# Patient Record
Sex: Male | Born: 1995 | Race: Black or African American | Hispanic: No | Marital: Single | Smoking: Never smoker
Health system: Southern US, Community
[De-identification: ages and names within clinical notes are randomized; demographics above are authoritative.]

---
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
## 2015-10-24 ENCOUNTER — Emergency Department (HOSPITAL_COMMUNITY)
Admission: EM | Admit: 2015-10-24 | Discharge: 2015-10-24 | Disposition: A | Discharge status: Home / Self Care | Payer: Self-pay | Attending: Emergency Medicine | Admitting: Emergency Medicine

## 2015-10-24 ENCOUNTER — Encounter (HOSPITAL_COMMUNITY): Payer: Self-pay | Admitting: Vascular Surgery

## 2015-10-24 DIAGNOSIS — Z202 Contact with and (suspected) exposure to infections with a predominantly sexual mode of transmission: Secondary | ICD-10-CM | POA: Insufficient documentation

## 2015-10-24 LAB — URINALYSIS, ROUTINE W REFLEX MICROSCOPIC
BILIRUBIN URINE: NEGATIVE
GLUCOSE, UA: NEGATIVE mg/dL
HGB URINE DIPSTICK: NEGATIVE
KETONES UR: NEGATIVE mg/dL
Leukocytes, UA: NEGATIVE
Nitrite: NEGATIVE
PH: 6.5 (ref 5.0–8.0)
Protein, ur: NEGATIVE mg/dL
Specific Gravity, Urine: 1.023 (ref 1.005–1.030)

## 2015-10-24 NOTE — ED Notes (Signed)
Declined W/C at D/C and was escorted to lobby by RN. 

## 2015-10-24 NOTE — ED Triage Notes (Signed)
PT up to BR to collect urine sample.
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
## 2015-10-25 LAB — GC/CHLAMYDIA PROBE AMP (~~LOC~~) NOT AT ARMC
Chlamydia: NEGATIVE
NEISSERIA GONORRHEA: NEGATIVE

## 2015-10-25 LAB — HIV ANTIBODY (ROUTINE TESTING W REFLEX): HIV SCREEN 4TH GENERATION: NONREACTIVE

## 2015-10-25 LAB — RPR: RPR Ser Ql: NONREACTIVE

## 2016-03-11 ENCOUNTER — Emergency Department (HOSPITAL_COMMUNITY): Payer: No Typology Code available for payment source

## 2016-03-11 ENCOUNTER — Encounter (HOSPITAL_COMMUNITY): Payer: Self-pay

## 2016-03-11 ENCOUNTER — Emergency Department (HOSPITAL_COMMUNITY)
Admission: EM | Admit: 2016-03-11 | Discharge: 2016-03-11 | Disposition: A | Discharge status: Home / Self Care | Payer: No Typology Code available for payment source | Attending: Emergency Medicine | Admitting: Emergency Medicine

## 2016-03-11 DIAGNOSIS — Y92410 Unspecified street and highway as the place of occurrence of the external cause: Secondary | ICD-10-CM | POA: Insufficient documentation

## 2016-03-11 DIAGNOSIS — Y999 Unspecified external cause status: Secondary | ICD-10-CM | POA: Diagnosis not present

## 2016-03-11 DIAGNOSIS — M545 Low back pain, unspecified: Secondary | ICD-10-CM

## 2016-03-11 DIAGNOSIS — Y939 Activity, unspecified: Secondary | ICD-10-CM | POA: Insufficient documentation

## 2016-03-11 MED ORDER — IBUPROFEN 800 MG PO TABS: 800.0000 mg | ORAL_TABLET | Freq: Three times a day (TID) | ORAL | 0 refills | Status: AC

## 2016-03-11 MED ORDER — CYCLOBENZAPRINE HCL 10 MG PO TABS: 10.0000 mg | ORAL_TABLET | Freq: Two times a day (BID) | ORAL | 0 refills | Status: AC | PRN
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
## 2016-05-12 ENCOUNTER — Emergency Department (HOSPITAL_BASED_OUTPATIENT_CLINIC_OR_DEPARTMENT_OTHER)
Admission: EM | Admit: 2016-05-12 | Discharge: 2016-05-12 | Disposition: A | Discharge status: Home / Self Care | Payer: BLUE CROSS/BLUE SHIELD | Attending: Emergency Medicine | Admitting: Emergency Medicine

## 2016-05-12 ENCOUNTER — Encounter (HOSPITAL_BASED_OUTPATIENT_CLINIC_OR_DEPARTMENT_OTHER): Payer: Self-pay | Admitting: Adult Health

## 2016-05-12 DIAGNOSIS — R300 Dysuria: Secondary | ICD-10-CM | POA: Diagnosis present

## 2016-05-12 DIAGNOSIS — Z79899 Other long term (current) drug therapy: Secondary | ICD-10-CM | POA: Diagnosis not present

## 2016-05-12 DIAGNOSIS — Z791 Long term (current) use of non-steroidal anti-inflammatories (NSAID): Secondary | ICD-10-CM | POA: Insufficient documentation

## 2016-05-12 DIAGNOSIS — N342 Other urethritis: Secondary | ICD-10-CM

## 2016-05-12 LAB — URINALYSIS, ROUTINE W REFLEX MICROSCOPIC
BILIRUBIN URINE: NEGATIVE
GLUCOSE, UA: NEGATIVE mg/dL
HGB URINE DIPSTICK: NEGATIVE
Ketones, ur: NEGATIVE mg/dL
Leukocytes, UA: NEGATIVE
Nitrite: NEGATIVE
PROTEIN: NEGATIVE mg/dL
SPECIFIC GRAVITY, URINE: 1.005 (ref 1.005–1.030)
pH: 6.5 (ref 5.0–8.0)

## 2016-05-12 MED ORDER — CEFTRIAXONE SODIUM 250 MG IJ SOLR
250.0000 mg | INTRAMUSCULAR | Status: DC
  Administered 2016-05-12: 250 mg via INTRAMUSCULAR
  Filled 2016-05-12: qty 250

## 2016-05-12 MED ORDER — AZITHROMYCIN 250 MG PO TABS
1000.0000 mg | ORAL_TABLET | Freq: Once | ORAL | Status: AC
  Administered 2016-05-12: 1000 mg via ORAL
  Filled 2016-05-12: qty 4

## 2016-05-12 NOTE — ED Provider Notes (Addendum)
[facility]   CSN: [id number] Arrival date & time: 05/12/16  1335     History   Chief Complaint Chief Complaint  Patient presents with  . Dysuria    HPI Jerome Bishop is a 21 y.o. male. Chief complaint is burning with urination  HPI:  Patient presents with 2 days of burning with urination. No frank discharge. States that he had unprotected intercourse last week and then 5 days ago before symptoms started. No blood. No discharge spontaneously. No testicular pain or lesions. No additional concerns. History of STD in the past.  History reviewed. No pertinent past medical history.  There are no active problems to display for this patient.   History reviewed. No pertinent surgical history.     Home Medications    Prior to Admission medications   Medication Sig Start Date End Date Taking? Authorizing Provider  cyclobenzaprine (FLEXERIL) 10 MG tablet Take 1 tablet (10 mg total) by mouth 2 (two) times daily as needed for muscle spasms. 03/11/16   Emi HolesAlexandra M Law, PA-C  ibuprofen (ADVIL,MOTRIN) 800 MG tablet Take 1 tablet (800 mg total) by mouth 3 (three) times daily. 03/11/16   Emi HolesAlexandra M Law, PA-C    Family History History reviewed. No pertinent family history.  Social History Social History  Substance Use Topics  . Smoking status: Never Smoker  . Smokeless tobacco: Never Used  . Alcohol use No     Allergies   Penicillins   Review of Systems Review of Systems  Constitutional: Negative for appetite change, chills, diaphoresis, fatigue and fever.  HENT: Negative for mouth sores, sore throat and trouble swallowing.   Eyes: Negative for visual disturbance.  Respiratory: Negative for cough, chest tightness, shortness of breath and wheezing.   Cardiovascular: Negative for chest pain.  Gastrointestinal: Negative for abdominal distention, abdominal pain, diarrhea, nausea and vomiting.  Endocrine: Negative for polydipsia, polyphagia and  polyuria.  Genitourinary: Positive for dysuria and penile pain. Negative for frequency and hematuria.  Musculoskeletal: Negative for gait problem.  Skin: Negative for color change, pallor and rash.  Neurological: Negative for dizziness, syncope, light-headedness and headaches.  Hematological: Does not bruise/bleed easily.  Psychiatric/Behavioral: Negative for behavioral problems and confusion.     Physical Exam Updated Vital Signs BP 130/81 (BP Location: Right Arm)   Pulse 82   Temp 98.7 F (37.1 C) (Oral)   Resp 16   Ht 5\' 10"  (1.778 m)   Wt 154 lb 9 oz (70.1 kg)   SpO2 96%   BMI 22.18 kg/m   Physical Exam  Constitutional: He is oriented to person, place, and time. He appears well-developed and well-nourished. No distress.  HENT:  Head: Normocephalic.  Eyes: Conjunctivae are normal. Pupils are equal, round, and reactive to light. No scleral icterus.  Neck: Normal range of motion. Neck supple. No thyromegaly present.  Cardiovascular: Normal rate and regular rhythm.  Exam reveals no gallop and no friction rub.   No murmur heard. Pulmonary/Chest: Effort normal and breath sounds normal. No respiratory distress. He has no wheezes. He has no rales.  Abdominal: Soft. Bowel sounds are normal. He exhibits no distension. There is no tenderness. There is no rebound.  Genitourinary:  Genitourinary Comments: Normal GU exam. Patient had just urinated specimen was collected. No discharge noted. No lesions or sores. No pelvic lymphadenopathy.  Musculoskeletal: Normal range of motion.  Neurological: He is alert and oriented to person, place, and time.  Skin: Skin is warm and dry. No rash  noted.  Psychiatric: He has a normal mood and affect. His behavior is normal.     ED Treatments / Results  Labs (all labs ordered are listed, but only abnormal results are displayed) Labs Reviewed  URINALYSIS, ROUTINE W REFLEX MICROSCOPIC  GC/CHLAMYDIA PROBE AMP ([place]) NOT AT [facility]    EKG   EKG Interpretation None       Radiology No results found.  Procedures Procedures (including critical care time)  Medications Ordered in ED Medications  cefTRIAXone (ROCEPHIN) injection 250 mg (250 mg Intramuscular Given 05/12/16 1441)  azithromycin (ZITHROMAX) tablet 1,000 mg (1,000 mg Oral Given 05/12/16 1440)     Initial Impression / Assessment and Plan / ED Course  I have reviewed the triage vital signs and the nursing notes.  Pertinent labs & imaging results that were available during my care of the patient were reviewed by me and considered in my medical decision making (see chart for details).     Likely urethritis. Urine normal. GC chlamydia pending. I offered empiric treatment versus awaiting culture results and call back. He has a strong preference for empiric treatment. Given IM Rocephin 250 mg, and by mouth Zithromax 1 g. He is observed for 30 minutes after his Rocephin injection because of a reported history of penicillin allergy. He had no reaction. Discharged in stable condition. Use a condom.  Final Clinical Impressions(s) / ED Diagnoses   Final diagnoses:  Urethritis    New Prescriptions Discharge Medication List as of 05/12/2016  3:24 PM       Rolland Porter, MD 05/12/16 1551    Rolland Porter, MD 05/12/16 [phone]
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
## 2016-05-12 NOTE — ED Notes (Signed)
Patient denies pain and is resting comfortably.  

## 2016-12-21 ENCOUNTER — Emergency Department (HOSPITAL_BASED_OUTPATIENT_CLINIC_OR_DEPARTMENT_OTHER)
Admission: EM | Admit: 2016-12-21 | Discharge: 2016-12-21 | Disposition: A | Discharge status: Home / Self Care | Payer: BLUE CROSS/BLUE SHIELD | Attending: Emergency Medicine | Admitting: Emergency Medicine

## 2016-12-21 ENCOUNTER — Encounter (HOSPITAL_BASED_OUTPATIENT_CLINIC_OR_DEPARTMENT_OTHER): Payer: Self-pay | Admitting: *Deleted

## 2016-12-21 DIAGNOSIS — Z202 Contact with and (suspected) exposure to infections with a predominantly sexual mode of transmission: Secondary | ICD-10-CM | POA: Insufficient documentation

## 2016-12-21 DIAGNOSIS — Z711 Person with feared health complaint in whom no diagnosis is made: Secondary | ICD-10-CM

## 2016-12-21 LAB — URINALYSIS, ROUTINE W REFLEX MICROSCOPIC
Bilirubin Urine: NEGATIVE
Glucose, UA: NEGATIVE mg/dL
Hgb urine dipstick: NEGATIVE
Ketones, ur: NEGATIVE mg/dL
LEUKOCYTES UA: NEGATIVE
NITRITE: NEGATIVE
Protein, ur: NEGATIVE mg/dL
SPECIFIC GRAVITY, URINE: 1.015 (ref 1.005–1.030)
pH: 8.5 — ABNORMAL HIGH (ref 5.0–8.0)

## 2016-12-21 MED ORDER — AZITHROMYCIN 1 G PO PACK
1.0000 g | PACK | Freq: Once | ORAL | Status: AC
  Administered 2016-12-21: 1 g via ORAL
  Filled 2016-12-21: qty 1

## 2016-12-21 MED ORDER — CEFTRIAXONE SODIUM 250 MG IJ SOLR
250.0000 mg | Freq: Once | INTRAMUSCULAR | Status: AC
  Administered 2016-12-21: 250 mg via INTRAMUSCULAR
  Filled 2016-12-21 (×2): qty 250

## 2016-12-21 MED ORDER — METRONIDAZOLE 500 MG PO TABS
2000.0000 mg | ORAL_TABLET | Freq: Once | ORAL | Status: AC
  Administered 2016-12-21: 2000 mg via ORAL
  Filled 2016-12-21: qty 4

## 2016-12-21 MED ORDER — ONDANSETRON 4 MG PO TBDP
4.0000 mg | ORAL_TABLET | Freq: Once | ORAL | Status: AC
  Administered 2016-12-21: 4 mg via ORAL
  Filled 2016-12-21: qty 1

## 2016-12-21 NOTE — ED Triage Notes (Signed)
Exposure to an STD. No symptoms.

## 2016-12-21 NOTE — Discharge Instructions (Signed)
You were treated for gonorrhea, chlamydia, and trichomonas.   Use condoms , safe sex practices.   See your doctor  Return to ER if you have dysuria, penile discharge.

## 2016-12-21 NOTE — ED Provider Notes (Signed)
[facility]   CSN: [id number] Arrival date & time: 12/21/16  1641     History   Chief Complaint Chief Complaint  Patient presents with  . Exposure to STD    HPI Jerome Bishop is a 21 y.o. male here presenting with STD check. Patient states that he is sexually active with one male partner and she was just in the ER and was diagnosed with Trichomonas, bacterial vaginosis. He states that he wants to get checked and treated for STD. Denies any penile discharge or dysuria or rash.  The history is provided by the patient.    History reviewed. No pertinent past medical history.  There are no active problems to display for this patient.   History reviewed. No pertinent surgical history.     Home Medications    Prior to Admission medications   Medication Sig Start Date End Date Taking? Authorizing Provider  cyclobenzaprine (FLEXERIL) 10 MG tablet Take 1 tablet (10 mg total) by mouth 2 (two) times daily as needed for muscle spasms. 03/11/16   Law, Waylan Boga, PA-C  ibuprofen (ADVIL,MOTRIN) 800 MG tablet Take 1 tablet (800 mg total) by mouth 3 (three) times daily. 03/11/16   Emi Holes, PA-C    Family History No family history on file.  Social History Social History  Substance Use Topics  . Smoking status: Never Smoker  . Smokeless tobacco: Never Used  . Alcohol use No     Allergies   Penicillins   Review of Systems Review of Systems  Genitourinary: Negative for discharge, dysuria, genital sores and hematuria.  All other systems reviewed and are negative.    Physical Exam Updated Vital Signs BP (!) 151/105 (BP Location: Right Arm)   Pulse 63   Temp (!) 97.5 F (36.4 C) (Oral)   Resp 18   Ht  (1.803 m)   Wt 72.6 kg (160 lb)   SpO2 98%   BMI 22.32 kg/m   Physical Exam  Constitutional: He appears well-developed.  HENT:  Head: Normocephalic.  Eyes: Pupils are equal, round, and reactive to light.  Neck: Normal  range of motion.  Cardiovascular: Normal rate.   Pulmonary/Chest: Effort normal.  Abdominal: Soft.  Genitourinary:  Genitourinary Comments: No obvious inguinal rash, no inguinal LAD, no penile discharge   Musculoskeletal: Normal range of motion.  Neurological: He is alert.  Skin: Skin is warm.  Psychiatric: He has a normal mood and affect.  Nursing note and vitals reviewed.    ED Treatments / Results  Labs (all labs ordered are listed, but only abnormal results are displayed) Labs Reviewed  URINALYSIS, ROUTINE W REFLEX MICROSCOPIC - Abnormal; Notable for the following:       Result Value   pH 8.5 (*)    All other components within normal limits  GC/CHLAMYDIA PROBE AMP ([place]) NOT AT [facility]    EKG  EKG Interpretation None       Radiology No results found.  Procedures Procedures (including critical care time)  Medications Ordered in ED Medications  metroNIDAZOLE (FLAGYL) tablet 2,000 mg (2,000 mg Oral Given 12/21/16 1757)  ondansetron (ZOFRAN-ODT) disintegrating tablet 4 mg (4 mg Oral Given 12/21/16 1757)  cefTRIAXone (ROCEPHIN) injection 250 mg (250 mg Intramuscular Given 12/21/16 1757)  azithromycin (ZITHROMAX) powder 1 g (1 g Oral Given 12/21/16 1757)     Initial Impression / Assessment and Plan / ED Course  I have reviewed the triage vital signs and the nursing notes.  Pertinent labs & imaging results that were available during my care of the patient were reviewed by me and considered in my medical decision making (see chart for details).     Jerome Bishop is a 21 y.o. male here with possible STD exposure. Girlfriend had trichomonas but he has no discharge or dysuria. UA nl. Swabbed for GC/chlamydia. Will give rocephin, azithro. He request to be treated for trichomonas so will give 2 g flagyl.    Final Clinical Impressions(s) / ED Diagnoses   Final diagnoses:  None    New Prescriptions New Prescriptions   No medications on file     Charlynne Pander, MD 12/21/16 1825
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
